# Patient Record
Sex: Male | Born: 1980 | Race: White | Hispanic: No | Marital: Married | Smoking: Never smoker
Health system: Southern US, Community
[De-identification: ages and names within clinical notes are randomized; demographics above are authoritative.]

## PROBLEM LIST (undated history)

## (undated) DIAGNOSIS — R7303 Prediabetes: Secondary | ICD-10-CM

## (undated) DIAGNOSIS — E559 Vitamin D deficiency, unspecified: Secondary | ICD-10-CM

## (undated) DIAGNOSIS — I10 Essential (primary) hypertension: Secondary | ICD-10-CM

## (undated) HISTORY — DX: Prediabetes: R73.03

## (undated) HISTORY — DX: Vitamin D deficiency, unspecified: E55.9

## (undated) HISTORY — DX: Essential (primary) hypertension: I10

---
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
## 1999-12-12 ENCOUNTER — Encounter (INDEPENDENT_AMBULATORY_CARE_PROVIDER_SITE_OTHER): Payer: Self-pay | Admitting: *Deleted

## 1999-12-12 ENCOUNTER — Ambulatory Visit (HOSPITAL_COMMUNITY): Admission: RE | Admit: 1999-12-12 | Discharge: 1999-12-12 | Payer: Self-pay | Admitting: Gastroenterology

## 2002-04-12 ENCOUNTER — Inpatient Hospital Stay (HOSPITAL_COMMUNITY): Admission: EM | Admit: 2002-04-12 | Discharge: 2002-04-13 | Payer: Self-pay | Admitting: Emergency Medicine

## 2004-03-20 ENCOUNTER — Ambulatory Visit: Payer: Self-pay | Admitting: Internal Medicine

## 2004-03-23 ENCOUNTER — Ambulatory Visit (HOSPITAL_COMMUNITY): Admission: RE | Admit: 2004-03-23 | Discharge: 2004-03-23 | Payer: Self-pay | Admitting: Internal Medicine

## 2004-04-06 ENCOUNTER — Ambulatory Visit: Payer: Self-pay | Admitting: Internal Medicine

## 2004-07-05 ENCOUNTER — Ambulatory Visit (HOSPITAL_COMMUNITY): Admission: RE | Admit: 2004-07-05 | Discharge: 2004-07-05 | Payer: Self-pay | Admitting: Internal Medicine

## 2004-07-05 ENCOUNTER — Ambulatory Visit: Payer: Self-pay | Admitting: Internal Medicine

## 2008-05-13 ENCOUNTER — Ambulatory Visit: Payer: Self-pay | Admitting: *Deleted

## 2008-05-13 DIAGNOSIS — I10 Essential (primary) hypertension: Secondary | ICD-10-CM | POA: Insufficient documentation

## 2008-05-16 LAB — CONVERTED CEMR LAB
ALT: 20 units/L (ref 0–53)
AST: 20 units/L (ref 0–37)
Albumin: 4.8 g/dL (ref 3.5–5.2)
Alkaline Phosphatase: 45 units/L (ref 39–117)
BUN: 13 mg/dL (ref 6–23)
Basophils Absolute: 0 10*3/uL (ref 0.0–0.1)
CO2: 31 meq/L (ref 19–32)
Calcium: 10 mg/dL (ref 8.4–10.5)
Creatinine, Ser: 1 mg/dL (ref 0.4–1.5)
Eosinophils Absolute: 0.2 10*3/uL (ref 0.0–0.7)
Eosinophils Relative: 3.3 % (ref 0.0–5.0)
GFR calc non Af Amer: 95 mL/min
Glucose, Bld: 117 mg/dL — ABNORMAL HIGH (ref 70–99)
HCT: 49.6 % (ref 39.0–52.0)
Hemoglobin: 17 g/dL (ref 13.0–17.0)
Lymphocytes Relative: 22 % (ref 12.0–46.0)
MCHC: 34.3 g/dL (ref 30.0–36.0)
MCV: 85.9 fL (ref 78.0–100.0)
Monocytes Absolute: 0.5 10*3/uL (ref 0.1–1.0)
Monocytes Relative: 7.3 % (ref 3.0–12.0)
Neutro Abs: 4.8 10*3/uL (ref 1.4–7.7)
Platelets: 198 10*3/uL (ref 150–400)
Potassium: 4 meq/L (ref 3.5–5.1)
Sodium: 140 meq/L (ref 135–145)
TSH: 0.88 microintl units/mL (ref 0.35–5.50)
Total Bilirubin: 1 mg/dL (ref 0.3–1.2)
Total Protein: 7.3 g/dL (ref 6.0–8.3)
WBC: 7 10*3/uL (ref 4.5–10.5)

## 2008-05-27 ENCOUNTER — Ambulatory Visit: Payer: Self-pay | Admitting: *Deleted

## 2008-05-27 DIAGNOSIS — J309 Allergic rhinitis, unspecified: Secondary | ICD-10-CM | POA: Insufficient documentation

## 2008-08-16 ENCOUNTER — Telehealth: Payer: Self-pay | Admitting: Internal Medicine

## 2008-10-24 ENCOUNTER — Telehealth: Payer: Self-pay | Admitting: Internal Medicine

## 2008-11-04 ENCOUNTER — Ambulatory Visit: Payer: Self-pay | Admitting: Internal Medicine

## 2008-11-04 LAB — CONVERTED CEMR LAB
BUN: 13 mg/dL (ref 6–23)
CO2: 27 meq/L (ref 19–32)
Calcium: 10 mg/dL (ref 8.4–10.5)
Chloride: 100 meq/L (ref 96–112)
Creatinine, Ser: 1.03 mg/dL (ref 0.40–1.50)
Glucose, Bld: 131 mg/dL — ABNORMAL HIGH (ref 70–99)
Hgb A1c MFr Bld: 5.6 % (ref 4.6–6.1)
Potassium: 3.9 meq/L (ref 3.5–5.3)
Sodium: 141 meq/L (ref 135–145)

## 2008-11-07 ENCOUNTER — Encounter: Payer: Self-pay | Admitting: Internal Medicine

## 2008-11-24 LAB — CONVERTED CEMR LAB
Dopamine 24 Hr Urine: 192 mcg/24hr (ref <500)
Metaneph Total, Ur: 489 ug/24hr (ref 94–604)
Metanephrines, Ur: 193 (ref 25–222)
Norepinephrine 24 Hr Urine: 35 mcg/24hr (ref <80)
Normetanephrine, 24H Ur: 296 (ref 40–412)

## 2010-04-01 DIAGNOSIS — E559 Vitamin D deficiency, unspecified: Secondary | ICD-10-CM

## 2010-04-01 HISTORY — DX: Vitamin D deficiency, unspecified: E55.9

## 2010-05-03 ENCOUNTER — Ambulatory Visit (HOSPITAL_BASED_OUTPATIENT_CLINIC_OR_DEPARTMENT_OTHER)
Admission: RE | Admit: 2010-05-03 | Discharge: 2010-05-03 | Disposition: A | Discharge status: Home / Self Care | Payer: BC Managed Care – PPO | Attending: Urology | Admitting: Urology

## 2010-05-03 DIAGNOSIS — Z302 Encounter for sterilization: Secondary | ICD-10-CM | POA: Insufficient documentation

## 2010-05-03 LAB — POCT I-STAT, CHEM 8
BUN: 16 mg/dL (ref 6–23)
Calcium, Ion: 1.29 mmol/L (ref 1.12–1.32)
Chloride: 104 mEq/L (ref 96–112)
Creatinine, Ser: 1 mg/dL (ref 0.4–1.5)
Glucose, Bld: 115 mg/dL — ABNORMAL HIGH (ref 70–99)
HCT: 46 % (ref 39.0–52.0)
Hemoglobin: 15.6 g/dL (ref 13.0–17.0)
Potassium: 4.2 mEq/L (ref 3.5–5.1)
Sodium: 140 mEq/L (ref 135–145)
TCO2: 28 mmol/L (ref 0–100)

## 2010-05-16 NOTE — Op Note (Signed)
  [name], Calvin Bradley                   ACCOUNT NO.:  [id number]  MEDICAL RECORD NO.:  [id number]          PATIENT TYPE:  AMB  LOCATION:  [facility]                         FACILITY:  [facility]  PHYSICIAN:  Excell Seltzer. Annabell Howells, M.D.    DATE OF BIRTH:  [birth year]  DATE OF PROCEDURE:  05/03/2010 DATE OF DISCHARGE:                              OPERATIVE REPORT   PROCEDURE:  Bilateral vasectomy.  PREOPERATIVE DIAGNOSIS:  Elective sterilization.  POSTOPERATIVE DIAGNOSIS:  Elective sterilization.  SURGEON:  Excell Seltzer. Annabell Howells, M.D.  ANESTHESIA:  General.  SPECIMENS:  Right and left vas segments which were discarded.  BLOOD LOSS:  Minimal.  COMPLICATIONS:  None.  INDICATIONS:  [name] is a 30 year old white male who desires vasectomy. He has had a prior right orchiopexy and exam in the office demonstrated some scarring of the right cord.  It was felt that general anesthetic would aid the vasectomy.  FINDINGS AND PROCEDURE:  He was taken to the operating room.  A general anesthetic was induced.  His scrotum was clipped.  He was prepped with Betadine solution and draped in usual sterile fashion.  Time-out was performed.  The anterior scrotum was infiltrated with 1 cc of lidocaine and a sharpened hemostat was used to make a puncture just to the left of midline.  The right vas was then captured with the ring clamp.  It took several attempts due to the perivasal scarring.  However, once the vas was captured, it was dissected free of the surrounding tissue using sharpened hemostat and 2 towel clips.  Once a sufficient length had been exposed, a section approximately 1 cm in length was removed.  The distal lumen was fulgurated with the Bovie.  A fascial interposition stitch was placed with 3-0 plain gut and the proximal end was doubly ligated with the gut.  Once the right vas had been managed, it was returned to the right hemiscrotum and the left vas was then delivered out more easily, dissected out  scarring.  A section was removed.  The distal lumen was cauterized.  The fascial interposition stitch was placed and the proximal end was doubly ligated with 3-0 gut.  The ends of the vas were returned to the right hemiscrotum.  Some minor bleeding was noted from the incision.  This was found to be from the dartos and was controlled with cautery and a single 3-0 chromic stitch.  Once hemostasis was achieved, the wound was closed with Dermabond.  A dressing of 4x4s, fluffs, Kerlix and scrotal support was applied.  His anesthetic was reversed.  He was moved to recovery room in stable condition.  There were no complications.     Excell Seltzer. Annabell Howells, M.D.     JJW/MEDQ  D:  05/03/2010  T:  05/03/2010  Job:  [id number]  Electronically Signed by Bjorn Pippin M.D. on 05/16/2010 09:25:09 AM

## 2010-08-17 NOTE — Procedures (Signed)
[place]. [facility]  Patient:    Calvin Bradley, Calvin Bradley                          MRN: [id number] Proc. Date: 12/12/99 Adm. Date:  [id number] Disc. Date: [id number] Attending:  Charna Elizabeth                           Procedure Report  DATE OF BIRTH:  [birth year]  PROCEDURE PERFORMED:  Colonoscopy with biopsies.  ENDOSCOPIST:  Anselmo Rod, M.D.  INSTRUMENT USED:  Olympus video colonoscope.  INDICATIONS:  Rectal bleeding in a 30 year old white male with previous history of juvenile polyps.  Rule out recurring polyps.  PREPROCEDURE PREPARATION:  Informed consent was procured from the patient. The patient was fasted for 8 hours prior to the procedure and prepped with a bottle of magnesium citrate and a gallon of NuLytely the night prior to the procedure.  PREPROCEDURE PHYSICAL:  Patient has stable vital signs.  NECK: Supple.  CHEST:  Clear to auscultation. S1, S2 regular.  ABDOMEN:  Soft with normal abdominal bowel sounds.  DESCRIPTION OF PROCEDURE:  The patient was placed in left lateral decubitus position and sedated with 100 mg of Demerol and 10 mg of Versed intravenously. Once the patient was adequately sedated and maintained on low-flow oxygen and continuous cardiac monitoring, the Olympus video colonoscope was advanced from the rectum to the cecum with slight difficulty secondary to some residual stool in the colon, especially on the right side.  A patch of erythema was seen  in the distal right colon near the hepatic flexure.  This was biopsied for pathology.  The nature of this seemed unclear.  No other abnormalities, ______, no masses or polyps were present.  The terminal ileum appeared normal except for some hyperplastic lymphoid follicles.  IMPRESSION:  Patch of erythema in the distal right colon, biopsied for pathology.  Otherwise normal-appearing colon and terminal ileum.  RECOMMENDATIONS: 1. Await pathology results. 2. Avoid  nonsteroidals. 3. Outpatient follow-up in the next two weeks. DD:  12/12/99 TD:  12/14/99 Job: [id number]

## 2010-08-17 NOTE — Op Note (Signed)
   Calvin Bradley, HONEA                             ACCOUNT NO.:  [id number]   MEDICAL RECORD NO.:  [id number]                   PATIENT TYPE:  INP   LOCATION:  5703                                 FACILITY:  [facility]   PHYSICIAN:  Alfonse Flavors, M.D.                 DATE OF BIRTH:  08-03-1980   DATE OF PROCEDURE:  04/13/2002  DATE OF DISCHARGE:                                 OPERATIVE REPORT   PREOPERATIVE DIAGNOSIS:  Tonsillar bleeding.   POSTOPERATIVE DIAGNOSIS:  Tonsillar bleeding.   OPERATION PERFORMED:  Control of tonsillar bleeding.   SURGEON:  Alfonse Flavors, M.D.   ANESTHESIA:  General endotracheal.   INDICATIONS FOR PROCEDURE:  The patient is a 30 year old patient of Dr.  Ezzard Standing, who had a tonsillectomy on January 8.  He was seen last night in the  emergency room for tonsillar bleeding and brought to the operating room.  Two bleeding areas in the right tonsillar fossa were controlled with  electrocautery. The patient did well through most of this night.  This  morning about 6 a.m. he began to have bleeding again.  Examination showed a  clot in the right tonsillar fossa.  He is returned to the operating room for  further control.   DESCRIPTION OF PROCEDURE:  The patient was brought to the operating room and  placed supine on the operating table.  He was induced with general  anesthesia, intubated with an orotracheal tube.  His face was draped in  sterile fashion.  The mouth was opened with a Crowe-Davis mouth gag.  Examination of the oropharynx showed clotted blood in the inferior right  tonsillar fossa.  This was removed with suction.  The patient was found to  have a small arterial source of bleeding several millimeters above the  bleeding site of last night.  The bleeding was controlled with suction  cautery.  All other hemorrhagic appearing areas were cauterized.  There was  a small dark area of clotted blood in the inferior left tonsillar fossa.  This was cauterized  also.  The patient tolerated the procedure well and was  taken to the recovery area in satisfactory condition.   FOLLOW UP:  Dr. Ezzard Standing will assume care of the patient. He will be observed  in the hospital through today, or possibly longer.                                                Alfonse Flavors, M.D.    JCM/MEDQ  D:  04/13/2002  T:  04/13/2002  Job:  [id number]

## 2010-08-17 NOTE — Op Note (Signed)
Calvin Bradley, Calvin Bradley                             ACCOUNT NO.:  [id number]   MEDICAL RECORD NO.:  [id number]                   PATIENT TYPE:  INP   LOCATION:  2853                                 FACILITY:  [facility]   PHYSICIAN:  Alfonse Flavors, M.D.                 DATE OF BIRTH:  Jan 29, 1981   DATE OF PROCEDURE:  04/12/2002  DATE OF DISCHARGE:                                 OPERATIVE REPORT   INDICATIONS/JUSTIFICATION FOR PROCEDURE:  The patient is a 30 year old  patient who had a tonsillectomy by Dr. Ezzard Standing on April 08, 2002. The  patient had a small amount of bleeding last night, and in the morning. He  saw Dr. Ezzard Standing in the office and was treated with silver nitrate. He began  to have bleeding again this evening. He had continued to bleed and contacted  me by telephone. He was examined in the emergency room and had active  bleeding from the right tonsillar fossa. He was a candidate for control of  his bleeding under general anesthesia in the operating room. The indications  and complications including persistent bleeding were discussed with the  patient and also his parents.   PREOPERATIVE DIAGNOSIS:  Tonsil bleeding.   POSTOPERATIVE DIAGNOSIS:  Tonsil bleeding.   PROCEDURE:  Control of tonsillar bleeding.   ANESTHESIA:  General endotracheal anesthesia.   SURGEON:  Alfonse Flavors, M.D.   DESCRIPTION OF PROCEDURE:  The patient was brought to the operating room and  placed on the operating table in the supine position. He was induced with  general anesthesia and intubated with an orotracheal tube. There was no  signs of any aspiration. The face was draped in a sterile fashion. His mouth  was opened with a Crowe-Davis mouth gag.   Examination of the oropharynx showed bright red blood in the right tonsillar  fossa. The tonsillar fossa was cleaned with suction cautery. Active bleeding  areas were found at the inferior aspect of the tonsillar fossa. There was  also a small active  bleeding area in the mid tonsillar fossa. Both of these  areas were cauterized with electrocautery, successfully controlling  bleeding. There was no active bleeding from the left tonsillar fossa.   A nasogastric tube was passed into the esophagus but would not pass the  gastroesophageal junction. The effort was discontinued in attempting to pass  the tube without undue pressure on the esophagus.   The patient was taken to the recovery room in satisfactory condition. The  patient will be admitted for overnight observation and intravenous hydration  and analgesia. His discharge is anticipated in the morning. Discharge  medications will be those provided by Dr. Ezzard Standing.  Alfonse Flavors, M.D.     JCM/MEDQ  D:  04/12/2002  T:  04/12/2002  Job:  [id number]

## 2010-08-31 DIAGNOSIS — R7303 Prediabetes: Secondary | ICD-10-CM

## 2010-08-31 HISTORY — DX: Prediabetes: R73.03

## 2011-10-21 ENCOUNTER — Ambulatory Visit (HOSPITAL_COMMUNITY)
Admission: RE | Admit: 2011-10-21 | Discharge: 2011-10-21 | Disposition: A | Discharge status: Home / Self Care | Payer: BC Managed Care – PPO | Source: Ambulatory Visit | Attending: Internal Medicine | Admitting: Internal Medicine

## 2011-10-21 ENCOUNTER — Other Ambulatory Visit: Payer: Self-pay | Admitting: Internal Medicine

## 2011-10-21 ENCOUNTER — Other Ambulatory Visit (HOSPITAL_COMMUNITY): Payer: Self-pay | Admitting: Internal Medicine

## 2011-10-21 ENCOUNTER — Ambulatory Visit
Admission: RE | Admit: 2011-10-21 | Discharge: 2011-10-21 | Disposition: A | Discharge status: Home / Self Care | Payer: BC Managed Care – PPO | Source: Ambulatory Visit | Attending: Internal Medicine | Admitting: Internal Medicine

## 2011-10-21 DIAGNOSIS — R079 Chest pain, unspecified: Secondary | ICD-10-CM | POA: Insufficient documentation

## 2011-10-21 DIAGNOSIS — R071 Chest pain on breathing: Secondary | ICD-10-CM | POA: Insufficient documentation

## 2011-10-21 DIAGNOSIS — R109 Unspecified abdominal pain: Secondary | ICD-10-CM

## 2013-01-04 IMAGING — US US ABDOMEN COMPLETE
1 series · 14 of 25 positions shown · non-contrast
Comparison: None.

CLINICAL DATA: Fall, right abdominal pain

COMPLETE ABDOMINAL ULTRASOUND

[Series 1: us abdomen complete · 0.18mm/px · 14 of 73 slices shown]
[im 1/73]
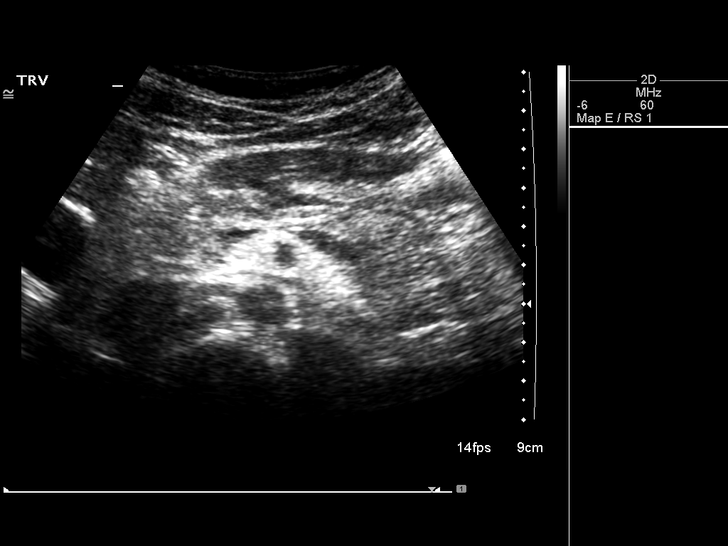
[im 7/73]
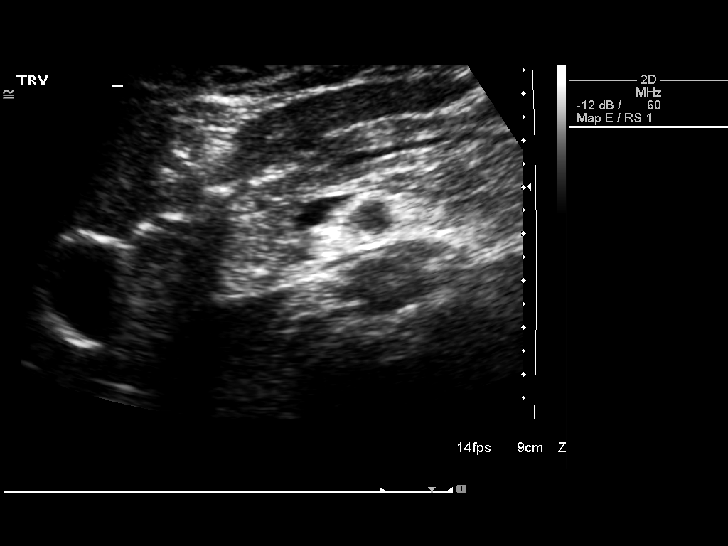
[im 13/73]
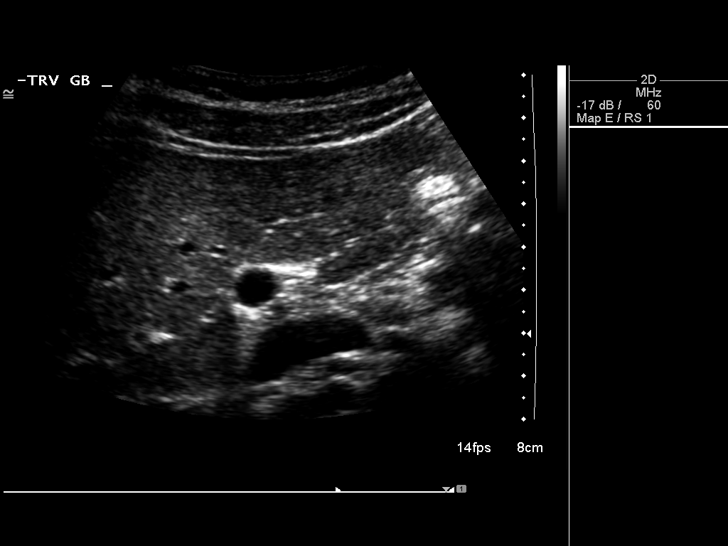
[im 19/73]
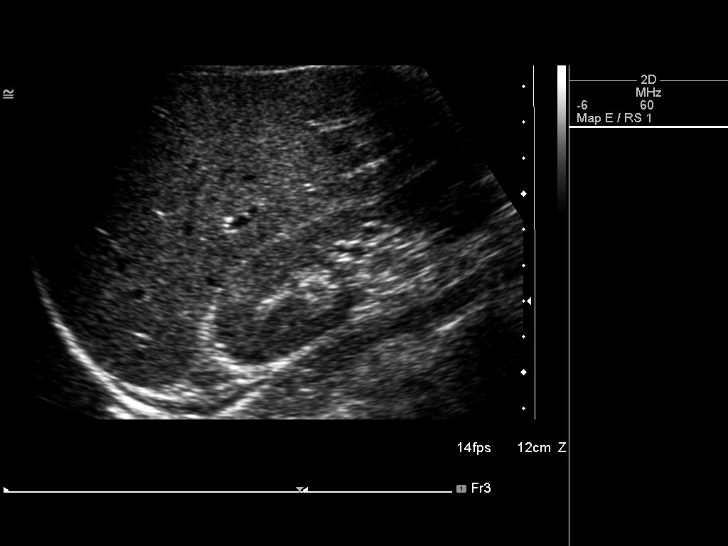
[im 25/73]
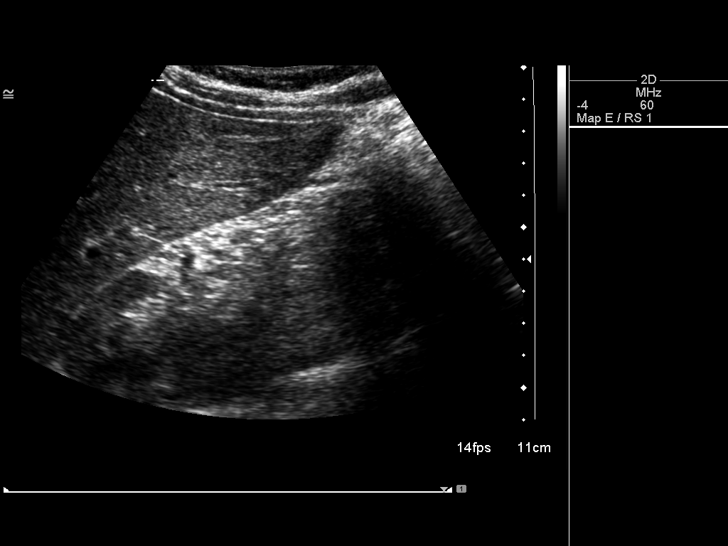
[im 28/73]
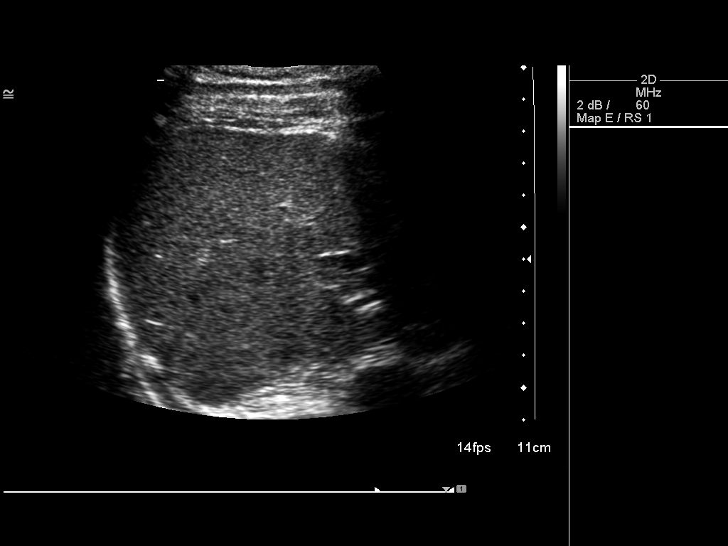
[im 34/73]
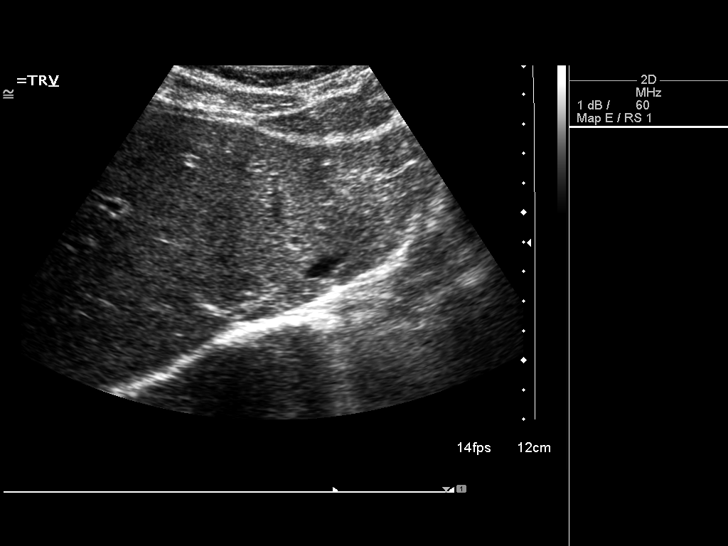
[im 40/73]
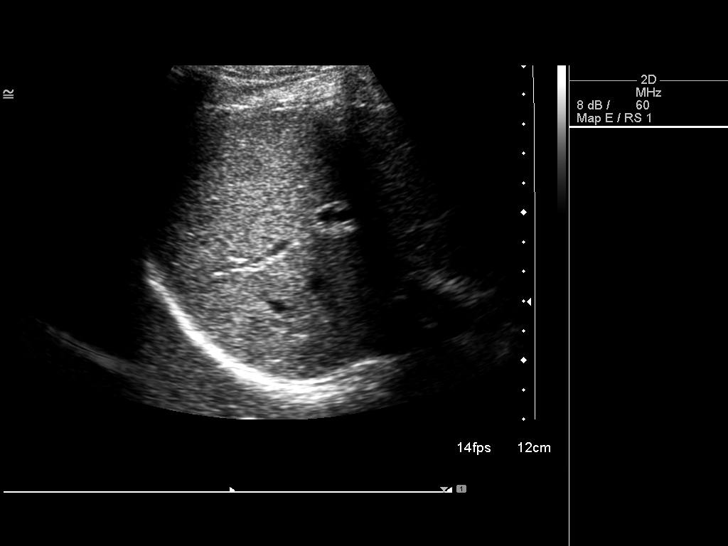
[im 46/73]
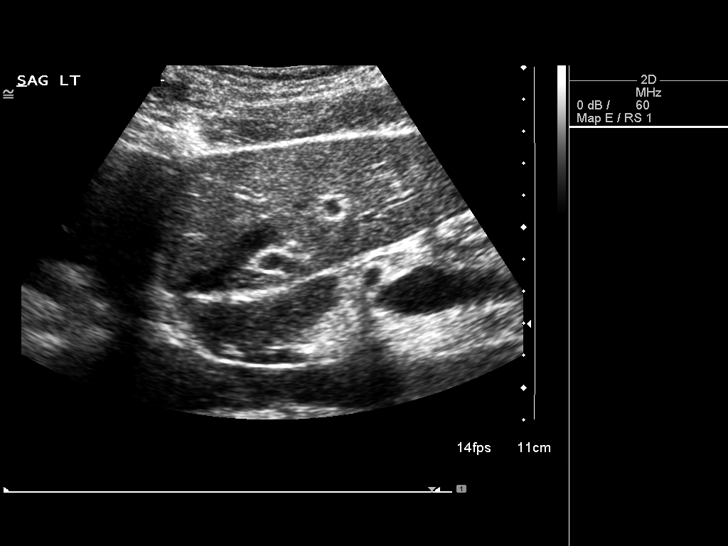
[im 49/73]
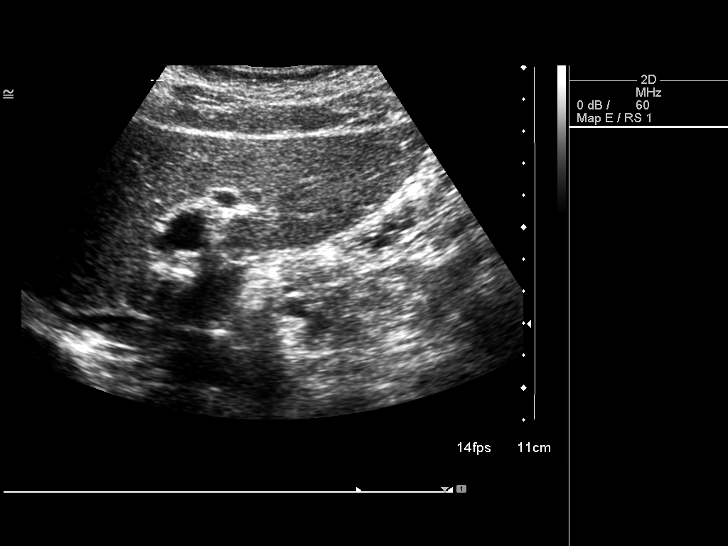
[im 55/73]
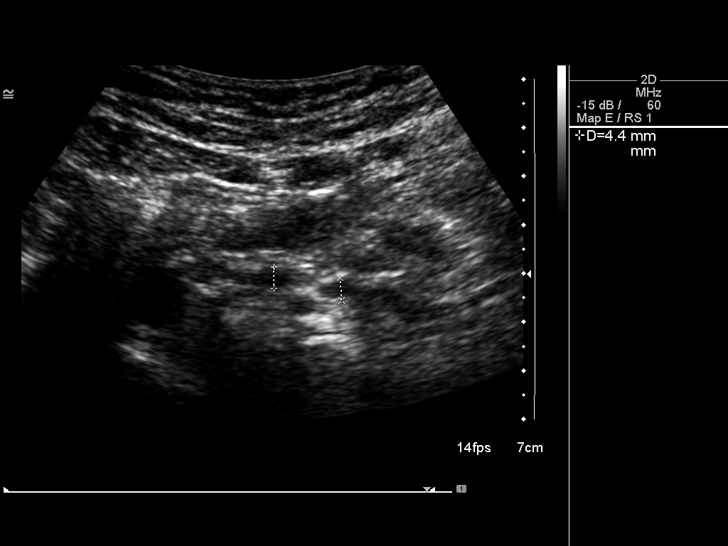
[im 61/73]
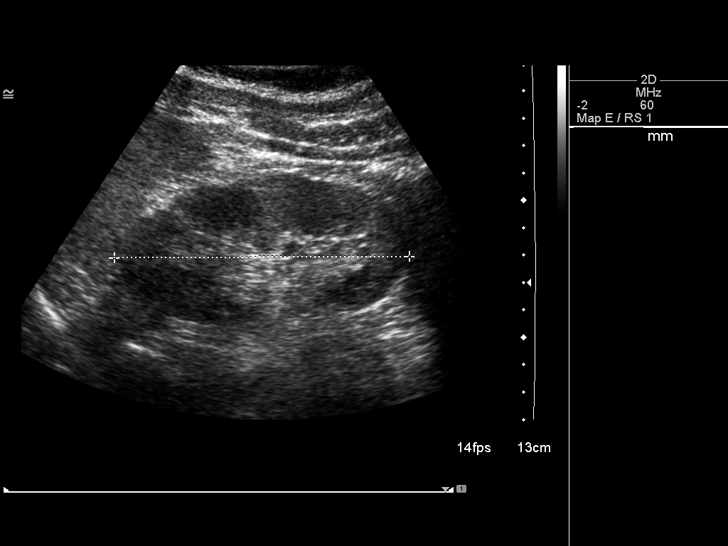
[im 67/73]
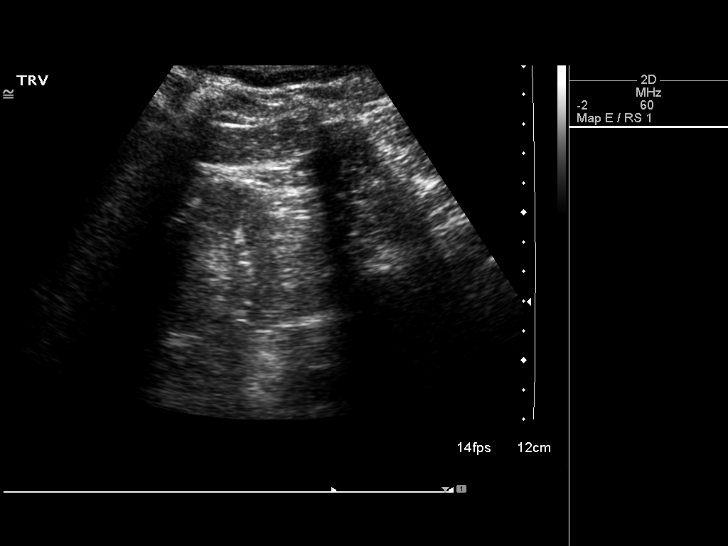
[im 73/73]
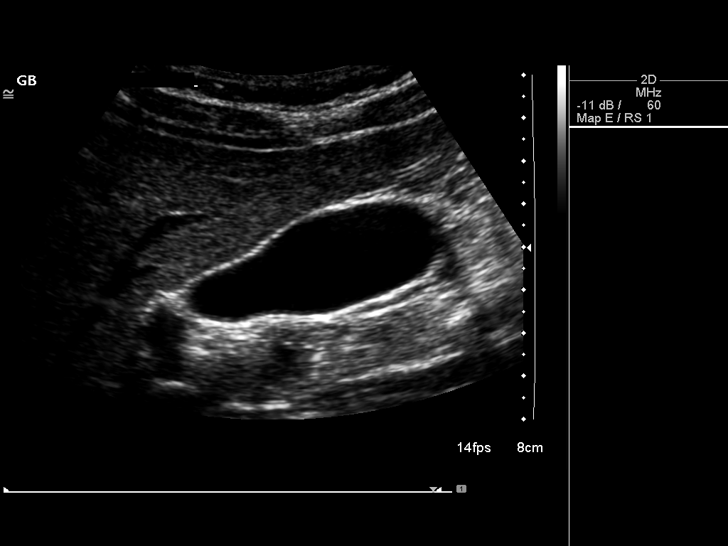

[14 of 25 positions shown; findings below may reference images not displayed]

FINDINGS: Gallbladder:  No gallstones, gallbladder wall thickening, or
pericholecystic fluid.  Negative sonographic Murphy's sign.

Common bile duct:  Measures 2 mm.

Liver:  No focal lesion identified.  Within normal limits in
parenchymal echogenicity.

IVC:  Appears normal.

Pancreas:  No focal abnormality seen.

Spleen:  Measures 6.7 cm.

Right Kidney:  Measures 10.8 cm.  No mass or hydronephrosis.

Left Kidney:  Measures 10.8 cm.  No mass or hydronephrosis.

Abdominal aorta:  No aneurysm identified.
IMPRESSION: Negative abdominal ultrasound.

## 2013-02-09 ENCOUNTER — Other Ambulatory Visit: Payer: Self-pay | Admitting: Emergency Medicine

## 2013-03-23 ENCOUNTER — Encounter: Payer: Self-pay | Admitting: Internal Medicine

## 2013-03-23 ENCOUNTER — Ambulatory Visit (INDEPENDENT_AMBULATORY_CARE_PROVIDER_SITE_OTHER): Payer: BC Managed Care – PPO | Admitting: Internal Medicine

## 2013-03-23 VITALS — BP 146/96 | HR 80 | Temp 99.3°F | Resp 16 | Wt 143.2 lb

## 2013-03-23 DIAGNOSIS — B349 Viral infection, unspecified: Secondary | ICD-10-CM

## 2013-03-23 DIAGNOSIS — J111 Influenza due to unidentified influenza virus with other respiratory manifestations: Secondary | ICD-10-CM

## 2013-03-23 DIAGNOSIS — B9789 Other viral agents as the cause of diseases classified elsewhere: Secondary | ICD-10-CM

## 2013-03-23 MED ORDER — AZITHROMYCIN 250 MG PO TABS: ORAL_TABLET | ORAL | Status: AC

## 2013-03-23 MED ORDER — PREDNISONE 20 MG PO TABS: ORAL_TABLET | ORAL | Status: DC

## 2013-03-23 MED ORDER — HYDROCODONE-ACETAMINOPHEN 5-325 MG PO TABS: 1.0000 | ORAL_TABLET | Freq: Four times a day (QID) | ORAL | Status: DC | PRN

## 2013-03-23 NOTE — Progress Notes (Signed)
   Subjective:    Patient ID: Calvin Bradley, male    DOB: 06-14-1980, 32 y.o.   MRN: [id number]  Cough This is a new problem. The current episode started in the past 7 days. The problem has been unchanged. The problem occurs constantly. The cough is non-productive. Associated symptoms include chest pain, chills, a fever, headaches, myalgias, nasal congestion, postnasal drip, rhinorrhea, a sore throat and sweats. Pertinent negatives include no ear congestion, ear pain, heartburn, hemoptysis, rash or shortness of breath.  Sore Throat  Associated symptoms include congestion, coughing and headaches. Pertinent negatives include no ear pain or shortness of breath.      Review of Systems  Constitutional: Positive for fever, chills, diaphoresis and fatigue.  HENT: Positive for congestion, postnasal drip, rhinorrhea and sore throat. Negative for ear pain.   Eyes: Negative.   Respiratory: Positive for cough. Negative for hemoptysis and shortness of breath.   Cardiovascular: Positive for chest pain.  Gastrointestinal: Negative for heartburn.  Genitourinary: Negative.   Musculoskeletal: Positive for myalgias.  Skin: Negative for rash.  Neurological: Positive for headaches.       Objective:   Physical Exam  Constitutional: He is oriented to person, place, and time. He appears well-developed and well-nourished.  HENT:  Head: Atraumatic.  Nose: Nose normal.  Mouth/Throat: Oropharynx is clear and moist.  Eyes: EOM are normal. Pupils are equal, round, and reactive to light.  Neck: Normal range of motion. Neck supple. No JVD present. No thyromegaly present.  Cardiovascular: Normal rate, regular rhythm and normal heart sounds.   No murmur heard. Pulmonary/Chest: Breath sounds normal. No respiratory distress. He has no wheezes. He has no rales. He exhibits no tenderness.  Abdominal: Soft.  Lymphadenopathy:    He has no cervical adenopathy.  Neurological: He is alert and oriented to person, place,  and time. No cranial nerve deficit. Coordination normal.  Skin: Skin is warm and dry. No rash noted. No erythema.  Psychiatric: He has a normal mood and affect.          Assessment & Plan:   1. Influenza with respiratory manifestations  Rx Zpak, Steroid pulse taper to prevent secondary bacterial infection, norco for aches/pains  2. Viral illness  - POCT Rapid Influenza A&B +positive for influenza A

## 2013-03-23 NOTE — Patient Instructions (Signed)

## 2013-07-09 ENCOUNTER — Encounter: Payer: Self-pay | Admitting: Internal Medicine

## 2013-07-09 NOTE — Progress Notes (Signed)
Patient ID: Calvin CantorKevin R Bradley, male   DOB: Sep 27, 1980, 33 y.o.   MRN: [id number]        E R R O R

## 2013-12-27 ENCOUNTER — Encounter: Payer: Self-pay | Admitting: Internal Medicine

## 2013-12-27 ENCOUNTER — Ambulatory Visit (INDEPENDENT_AMBULATORY_CARE_PROVIDER_SITE_OTHER): Payer: BC Managed Care – PPO | Admitting: Internal Medicine

## 2013-12-27 VITALS — BP 132/84 | HR 88 | Temp 97.0°F | Resp 16 | Ht 69.0 in | Wt 149.6 lb

## 2013-12-27 DIAGNOSIS — M542 Cervicalgia: Secondary | ICD-10-CM

## 2013-12-27 DIAGNOSIS — E559 Vitamin D deficiency, unspecified: Secondary | ICD-10-CM | POA: Insufficient documentation

## 2013-12-27 MED ORDER — PREDNISONE 20 MG PO TABS: ORAL_TABLET | ORAL | Status: DC

## 2013-12-27 MED ORDER — HYDROCODONE-ACETAMINOPHEN 5-325 MG PO TABS: ORAL_TABLET | ORAL | Status: DC

## 2013-12-27 NOTE — Patient Instructions (Signed)
Radicular Pain  Radicular pain in either the arm or leg is usually from a bulging or herniated disk in the spine. A piece of the herniated disk may press against the nerves as the nerves exit the spine. This causes pain which is felt at the tips of the nerves down the arm or leg. Other causes of radicular pain may include:   Fractures.   Heart disease.   Cancer.   An abnormal and usually degenerative state of the nervous system or nerves (neuropathy).  Diagnosis may require CT or MRI scanning to determine the primary cause.   Nerves that start at the neck (nerve roots) may cause radicular pain in the outer shoulder and arm. It can spread down to the thumb and fingers. The symptoms vary depending on which nerve root has been affected. In most cases radicular pain improves with conservative treatment. Neck problems may require physical therapy, a neck collar, or cervical traction. Treatment may take many weeks, and surgery may be considered if the symptoms do not improve.   Conservative treatment is also recommended for sciatica. Sciatica causes pain to radiate from the lower back or buttock area down the leg into the foot. Often there is a history of back problems. Most patients with sciatica are better after 2 to 4 weeks of rest and other supportive care. Short term bed rest can reduce the disk pressure considerably. Sitting, however, is not a good position since this increases the pressure on the disk. You should avoid bending, lifting, and all other activities which make the problem worse. Traction can be used in severe cases. Surgery is usually reserved for patients who do not improve within the first months of treatment.  Only take over-the-counter or prescription medicines for pain, discomfort, or fever as directed by your caregiver. Narcotics and muscle relaxants may help by relieving more severe pain and spasm and by providing mild sedation. Cold or massage can give significant relief. Spinal manipulation  is not recommended. It can increase the degree of disc protrusion. Epidural steroid injections are often effective treatment for radicular pain. These injections deliver medicine to the spinal nerve in the space between the protective covering of the spinal cord and back bones (vertebrae). Your caregiver can give you more information about steroid injections. These injections are most effective when given within two weeks of the onset of pain.   You should see your caregiver for follow up care as recommended. A program for neck and back injury rehabilitation with stretching and strengthening exercises is an important part of management.   SEEK IMMEDIATE MEDICAL CARE IF:   You develop increased pain, weakness, or numbness in your arm or leg.   You develop difficulty with bladder or bowel control.   You develop abdominal pain.  Document Released: 04/25/2004 Document Revised: 06/10/2011 Document Reviewed: 07/11/2008  ExitCare Patient Information 2015 [employer]. This information is not intended to replace advice given to you by your health care provider. Make sure you discuss any questions you have with your health care provider.    Sciatica  Sciatica is pain, weakness, numbness, or tingling along the path of the sciatic nerve. The nerve starts in the lower back and runs down the back of each leg. The nerve controls the muscles in the lower leg and in the back of the knee, while also providing sensation to the back of the thigh, lower leg, and the sole of your foot. Sciatica is a symptom of another medical condition. For instance, nerve   damage or certain conditions, such as a herniated disk or bone spur on the spine, pinch or put pressure on the sciatic nerve. This causes the pain, weakness, or other sensations normally associated with sciatica. Generally, sciatica only affects one side of the body.  CAUSES    Herniated or slipped disc.   Degenerative disk disease.   A pain disorder involving the narrow  muscle in the buttocks (piriformis syndrome).   Pelvic injury or fracture.   Pregnancy.   Tumor (rare).  SYMPTOMS   Symptoms can vary from mild to very severe. The symptoms usually travel from the low back to the buttocks and down the back of the leg. Symptoms can include:   Mild tingling or dull aches in the lower back, leg, or hip.   Numbness in the back of the calf or sole of the foot.   Burning sensations in the lower back, leg, or hip.   Sharp pains in the lower back, leg, or hip.   Leg weakness.   Severe back pain inhibiting movement.  These symptoms may get worse with coughing, sneezing, laughing, or prolonged sitting or standing. Also, being overweight may worsen symptoms.  DIAGNOSIS   Your caregiver will perform a physical exam to look for common symptoms of sciatica. He or she may ask you to do certain movements or activities that would trigger sciatic nerve pain. Other tests may be performed to find the cause of the sciatica. These may include:   Blood tests.   X-rays.   Imaging tests, such as an MRI or CT scan.  TREATMENT   Treatment is directed at the cause of the sciatic pain. Sometimes, treatment is not necessary and the pain and discomfort goes away on its own. If treatment is needed, your caregiver may suggest:   Over-the-counter medicines to relieve pain.   Prescription medicines, such as anti-inflammatory medicine, muscle relaxants, or narcotics.   Applying heat or ice to the painful area.   Steroid injections to lessen pain, irritation, and inflammation around the nerve.   Reducing activity during periods of pain.   Exercising and stretching to strengthen your abdomen and improve flexibility of your spine. Your caregiver may suggest losing weight if the extra weight makes the back pain worse.   Physical therapy.   Surgery to eliminate what is pressing or pinching the nerve, such as a bone spur or part of a herniated disk.  HOME CARE INSTRUCTIONS    Only take over-the-counter  or prescription medicines for pain or discomfort as directed by your caregiver.   Apply ice to the affected area for 20 minutes, 3-4 times a day for the first 48-72 hours. Then try heat in the same way.   Exercise, stretch, or perform your usual activities if these do not aggravate your pain.   Attend physical therapy sessions as directed by your caregiver.   Keep all follow-up appointments as directed by your caregiver.   Do not wear high heels or shoes that do not provide proper support.   Check your mattress to see if it is too soft. A firm mattress may lessen your pain and discomfort.  SEEK IMMEDIATE MEDICAL CARE IF:    You lose control of your bowel or bladder (incontinence).   You have increasing weakness in the lower back, pelvis, buttocks, or legs.   You have redness or swelling of your back.   You have a burning sensation when you urinate.   You have pain that gets worse when you   lie down or awakens you at night.   Your pain is worse than you have experienced in the past.   Your pain is lasting longer than 4 weeks.   You are suddenly losing weight without reason.  MAKE SURE YOU:   Understand these instructions.   Will watch your condition.   Will get help right away if you are not doing well or get worse.  Document Released: 03/12/2001 Document Revised: 09/17/2011 Document Reviewed: 07/28/2011  Exit[employer] Patient Information 2015 [employer]. This information is not intended to replace advice given to you by your health care provider. Make sure you discuss any questions you have with your health care provider.

## 2013-12-27 NOTE — Progress Notes (Signed)
   Subjective:    Patient ID: Calvin Bradley, male    DOB: [birth year], 33 y.o.   MRN: [id number]  HPI  Nice 33 yo MWM presenting with 3 week hx/o awaking with a "crick" in his rt neck subsequently radiating to the rt scapula and occas global numbness of the rt arm w/o any motor deficit noted. Pains varies in severity and occasionally awakens him and prevents sleep.  Also patient reports he has been off & out of his BP meds x 2 weeks and BP is noted normal today.   Medication List   bisoprolol 10 MG tablet  ---out x 2 weeks  Commonly known as:  ZEBETA  Take 10 mg by mouth daily.     VIAGRA 100 MG tablet  Generic drug:  sildenafil     Allergies  Allergen Reactions  . Lisinopril     Face and hand numbness    Review of Systems non contributory to above  Objective:   Physical Exam BP 132/84  Pulse 88  Temp(Src) 97 F (36.1 C) (Temporal)  Resp 16  Ht  (1.753 m)  Wt 149 lb 9.6 oz (67.858 kg)  BMI 22.08 kg/m2  HEENT - Eac's patent. TM's Nl. EOM's full. PERRLA. NasoOroPharynx clear. Neck - supple. Nl Thyroid. Carotids 2+ & No bruits, nodes, JVD Chest - Clear equal BS w/o Rales, rhonchi, wheezes. Cor - Nl HS. RRR w/o sig MGR. PP 1(+). No edema. Abd - No palpable organomegaly, masses or tenderness. BS nl. MS- FROM w/o deformities. Muscle power, tone and bulk Nl. Gait Nl. ROM RUE is nl with Nl sensory also.  Neuro - No obvious Cr N abnormalities. Sensory, motor and Cerebellar functions appear Nl w/o focal abnormalities. Psyche - Mental status normal & appropriate.  No delusions, ideations or obvious mood abnormalities.  Assessment & Plan:   1. Cervical pain (neck) - suspect nerve impingement.  - Rx Prednisone pulse/taper  -Rx Norco 5 prn with sedative precautions  Advised if not significantly improved in 7-10 days to call to schedule CX MRI.  Also advised monitor BP's & call if over 145/95.

## 2014-01-04 ENCOUNTER — Encounter: Payer: Self-pay | Admitting: Internal Medicine

## 2014-03-11 ENCOUNTER — Ambulatory Visit: Payer: 59 | Admitting: Internal Medicine

## 2014-03-13 NOTE — Progress Notes (Signed)
Patient ID: Calvin CantorKevin R Bradley, male   DOB: 04-06-1980   Calvin AcresN  O    S  H  O  W

## 2014-03-18 ENCOUNTER — Other Ambulatory Visit: Payer: Self-pay | Admitting: Internal Medicine

## 2014-03-23 ENCOUNTER — Other Ambulatory Visit: Payer: Self-pay | Admitting: Internal Medicine

## 2014-05-05 ENCOUNTER — Ambulatory Visit (INDEPENDENT_AMBULATORY_CARE_PROVIDER_SITE_OTHER): Payer: 59 | Admitting: Internal Medicine

## 2014-05-05 ENCOUNTER — Encounter: Payer: Self-pay | Admitting: Internal Medicine

## 2014-05-05 VITALS — BP 126/86 | HR 92 | Temp 98.6°F | Resp 16 | Ht 69.0 in | Wt 154.2 lb

## 2014-05-05 DIAGNOSIS — M542 Cervicalgia: Secondary | ICD-10-CM

## 2014-05-05 DIAGNOSIS — M5412 Radiculopathy, cervical region: Secondary | ICD-10-CM

## 2014-05-05 MED ORDER — TIZANIDINE HCL 4 MG PO TABS: 4.0000 mg | ORAL_TABLET | Freq: Four times a day (QID) | ORAL | Status: AC | PRN

## 2014-05-05 MED ORDER — SILDENAFIL CITRATE 100 MG PO TABS: ORAL_TABLET | ORAL | Status: AC

## 2014-05-05 MED ORDER — HYDROCODONE-ACETAMINOPHEN 5-325 MG PO TABS: ORAL_TABLET | ORAL | Status: AC

## 2014-05-05 MED ORDER — PREDNISONE 20 MG PO TABS
ORAL_TABLET | ORAL | Status: AC
Start: 2014-05-05 — End: 2014-06-03

## 2014-05-05 MED ORDER — SILDENAFIL CITRATE 100 MG PO TABS: ORAL_TABLET | ORAL | Status: DC

## 2014-05-05 NOTE — Patient Instructions (Signed)

## 2014-05-05 NOTE — Progress Notes (Signed)
   Subjective:    Patient ID: Calvin Bradley, male    DOB: 1980-05-11, 34 y.o.   MRN: [id number]  HPI Patient presents with a 2sd episode similar to one in Sept 2015 with Rt neck pain radiating to the RUE to the fingers. Current episode x 5 days and slightly improving - pain occas awakens from sleep. Has decreased strength of the RUE.   Medication Sig  . bisoprolol (ZEBETA) 10 MG tablet Take 10 mg by mouth daily.   Marland Kitchen. VIAGRA 100 MG tablet TAKE ONE-HALF TO ONE TABLET BY MOUTH ONCE DAILY AS NEEDED   Allergies  Allergen Reactions  . Lisinopril     Face and hand numbness    Past Medical History  Diagnosis Date  . Hypertension   . Prediabetes 08/2010  . Unspecified vitamin D deficiency 2012   Review of Systems Systems Review (+) as above.    Objective:   Physical Exam  BP 126/86 mmHg  Pulse 92  Temp(Src) 98.6 F (37 C)  Resp 16  Ht 5\' 9"  (1.753 m)  Wt 154 lb 3.2 oz (69.945 kg)  BMI 22.76 kg/m2  HEENT - Eac's patent. TM's Nl. EOM's full. PERRLA. NasoOroPharynx clear. Neck - supple. Nl Thyroid. Carotids 2+ & No bruits, nodes, JVD Chest - Clear equal BS w/o Rales, rhonchi, wheezes. Cor - Nl HS. RRR w/o sig MGR. PP 1(+). No edema. Abd - No palpable organomegaly, masses or tenderness. BS nl. MS- FROM w/o deformities. Muscle power, tone and bulk Nl. Gait Nl. Tender low right para cx muscle spasm.  Neuro - No obvious Cr N abnormalities. Sensory, motor and Cerebellar functions appear Nl w/o focal abnormalities. Bilat hand grips nl/equal. Sl decrease in Right biceps flexion.  Psyche - Mental status normal & appropriate.  No delusions, ideations or obvious mood abnormalities.    Assessment & Plan:   1. Cervicalgia   2. Cervical radiculitis   - Rx Prednisone pulse/taper - Rx Tizanidine 4mg  - Norco 5   - advised patient if not significantly improved in 5 day - will anticipate scheduling MRI to r/o Cx DDD / HNP.

## 2014-10-11 ENCOUNTER — Ambulatory Visit: Payer: Self-pay | Admitting: Internal Medicine

## 2015-01-11 ENCOUNTER — Encounter: Payer: Self-pay | Admitting: Internal Medicine

## 2015-04-05 ENCOUNTER — Encounter: Payer: Self-pay | Admitting: Internal Medicine

## 2018-07-23 ENCOUNTER — Other Ambulatory Visit: Payer: Self-pay

## 2018-07-23 ENCOUNTER — Emergency Department (HOSPITAL_COMMUNITY): Payer: PRIVATE HEALTH INSURANCE

## 2018-07-23 ENCOUNTER — Encounter (HOSPITAL_COMMUNITY): Payer: Self-pay | Admitting: *Deleted

## 2018-07-23 ENCOUNTER — Emergency Department (HOSPITAL_COMMUNITY): Payer: PRIVATE HEALTH INSURANCE | Admitting: Certified Registered Nurse Anesthetist

## 2018-07-23 ENCOUNTER — Encounter (HOSPITAL_COMMUNITY)
Admission: EM | Disposition: A | Discharge status: Home / Self Care | Payer: Self-pay | Source: Home / Self Care | Attending: Emergency Medicine

## 2018-07-23 ENCOUNTER — Ambulatory Visit: Admit: 2018-07-23 | Payer: Self-pay | Admitting: General Surgery

## 2018-07-23 ENCOUNTER — Ambulatory Visit (HOSPITAL_COMMUNITY)
Admission: EM | Admit: 2018-07-23 | Discharge: 2018-07-23 | Disposition: A | Discharge status: Home / Self Care | Payer: PRIVATE HEALTH INSURANCE | Attending: Emergency Medicine | Admitting: Emergency Medicine

## 2018-07-23 DIAGNOSIS — Z888 Allergy status to other drugs, medicaments and biological substances status: Secondary | ICD-10-CM | POA: Insufficient documentation

## 2018-07-23 DIAGNOSIS — Y990 Civilian activity done for income or pay: Secondary | ICD-10-CM | POA: Insufficient documentation

## 2018-07-23 DIAGNOSIS — N529 Male erectile dysfunction, unspecified: Secondary | ICD-10-CM | POA: Diagnosis not present

## 2018-07-23 DIAGNOSIS — S68119A Complete traumatic metacarpophalangeal amputation of unspecified finger, initial encounter: Secondary | ICD-10-CM

## 2018-07-23 DIAGNOSIS — S68623A Partial traumatic transphalangeal amputation of left middle finger, initial encounter: Secondary | ICD-10-CM | POA: Insufficient documentation

## 2018-07-23 DIAGNOSIS — I10 Essential (primary) hypertension: Secondary | ICD-10-CM | POA: Insufficient documentation

## 2018-07-23 DIAGNOSIS — W3182XA Contact with other commercial machinery, initial encounter: Secondary | ICD-10-CM | POA: Diagnosis not present

## 2018-07-23 DIAGNOSIS — Z79899 Other long term (current) drug therapy: Secondary | ICD-10-CM | POA: Insufficient documentation

## 2018-07-23 HISTORY — PX: AMPUTATION FINGER: SHX6594

## 2018-07-23 LAB — CBC WITH DIFFERENTIAL/PLATELET
Abs Immature Granulocytes: 0.02 10*3/uL (ref 0.00–0.07)
Basophils Absolute: 0.1 10*3/uL (ref 0.0–0.1)
Basophils Relative: 1 %
Eosinophils Absolute: 0.1 10*3/uL (ref 0.0–0.5)
Eosinophils Relative: 2 %
HCT: 46.1 % (ref 39.0–52.0)
Hemoglobin: 16.2 g/dL (ref 13.0–17.0)
Immature Granulocytes: 0 %
Lymphocytes Relative: 25 %
Lymphs Abs: 1.6 10*3/uL (ref 0.7–4.0)
MCH: 30.6 pg (ref 26.0–34.0)
MCHC: 35.1 g/dL (ref 30.0–36.0)
MCV: 87 fL (ref 80.0–100.0)
Monocytes Absolute: 0.5 10*3/uL (ref 0.1–1.0)
Monocytes Relative: 9 %
Neutro Abs: 4 10*3/uL (ref 1.7–7.7)
Neutrophils Relative %: 63 %
Platelets: 231 10*3/uL (ref 150–400)
RBC: 5.3 MIL/uL (ref 4.22–5.81)
RDW: 12.9 % (ref 11.5–15.5)
WBC: 6.3 10*3/uL (ref 4.0–10.5)
nRBC: 0 % (ref 0.0–0.2)

## 2018-07-23 LAB — COMPREHENSIVE METABOLIC PANEL
ALT: 35 U/L (ref 0–44)
AST: 28 U/L (ref 15–41)
Albumin: 4.5 g/dL (ref 3.5–5.0)
Alkaline Phosphatase: 79 U/L (ref 38–126)
Anion gap: 12 (ref 5–15)
BUN: 12 mg/dL (ref 6–20)
CO2: 26 mmol/L (ref 22–32)
Calcium: 9.6 mg/dL (ref 8.9–10.3)
Chloride: 102 mmol/L (ref 98–111)
Creatinine, Ser: 1.11 mg/dL (ref 0.61–1.24)
GFR calc Af Amer: >60 mL/min (ref >60)
GFR calc non Af Amer: >60 mL/min (ref >60)
Glucose, Bld: 139 mg/dL — ABNORMAL HIGH (ref 70–99)
Potassium: 3.9 mmol/L (ref 3.5–5.1)
Sodium: 140 mmol/L (ref 135–145)
Total Bilirubin: 0.9 mg/dL (ref 0.3–1.2)
Total Protein: 7.4 g/dL (ref 6.5–8.1)

## 2018-07-23 LAB — SURGICAL PCR SCREEN
MRSA, PCR: NEGATIVE
Staphylococcus aureus: NEGATIVE

## 2018-07-23 SURGERY — AMPUTATION, FINGER
Anesthesia: General | Site: Hand | Laterality: Left

## 2018-07-23 MED ORDER — CHLORHEXIDINE GLUCONATE 4 % EX LIQD
60.0000 mL | Freq: Once | CUTANEOUS | Status: DC
  Filled 2018-07-23: qty 60

## 2018-07-23 MED ORDER — 0.9 % SODIUM CHLORIDE (POUR BTL) OPTIME
TOPICAL | Status: DC | PRN
  Administered 2018-07-23: 1000 mL

## 2018-07-23 MED ORDER — ONDANSETRON HCL 4 MG/2ML IJ SOLN
INTRAMUSCULAR | Status: DC | PRN
  Administered 2018-07-23: 4 mg via INTRAVENOUS

## 2018-07-23 MED ORDER — SUCCINYLCHOLINE CHLORIDE 200 MG/10ML IV SOSY
PREFILLED_SYRINGE | INTRAVENOUS | Status: DC | PRN
  Administered 2018-07-23: 140 mg via INTRAVENOUS

## 2018-07-23 MED ORDER — DEXAMETHASONE SODIUM PHOSPHATE 10 MG/ML IJ SOLN
INTRAMUSCULAR | Status: DC | PRN
  Administered 2018-07-23: 5 mg via INTRAVENOUS

## 2018-07-23 MED ORDER — MIDAZOLAM HCL 2 MG/2ML IJ SOLN
INTRAMUSCULAR | Status: AC
  Filled 2018-07-23: qty 2

## 2018-07-23 MED ORDER — BUPIVACAINE HCL (PF) 0.25 % IJ SOLN
INTRAMUSCULAR | Status: DC | PRN
  Administered 2018-07-23: 12 mL

## 2018-07-23 MED ORDER — CEPHALEXIN 500 MG PO CAPS: 500.0000 mg | ORAL_CAPSULE | Freq: Four times a day (QID) | ORAL | 0 refills | Status: AC

## 2018-07-23 MED ORDER — FENTANYL CITRATE (PF) 250 MCG/5ML IJ SOLN
INTRAMUSCULAR | Status: AC
  Filled 2018-07-23: qty 5

## 2018-07-23 MED ORDER — MUPIROCIN 2 % EX OINT
1.0000 "application " | TOPICAL_OINTMENT | Freq: Once | CUTANEOUS | Status: AC
  Administered 2018-07-23: 1 via TOPICAL

## 2018-07-23 MED ORDER — LACTATED RINGERS IV SOLN
INTRAVENOUS | Status: DC
  Administered 2018-07-23: 13:00:00 via INTRAVENOUS

## 2018-07-23 MED ORDER — LIDOCAINE 2% (20 MG/ML) 5 ML SYRINGE
INTRAMUSCULAR | Status: DC | PRN
  Administered 2018-07-23: 60 mg via INTRAVENOUS

## 2018-07-23 MED ORDER — PROPOFOL 10 MG/ML IV BOLUS
INTRAVENOUS | Status: DC | PRN
  Administered 2018-07-23: 180 mg via INTRAVENOUS

## 2018-07-23 MED ORDER — SODIUM CHLORIDE 0.9 % IV BOLUS
1000.0000 mL | Freq: Once | INTRAVENOUS | Status: AC
  Administered 2018-07-23: 1000 mL via INTRAVENOUS

## 2018-07-23 MED ORDER — MUPIROCIN 2 % EX OINT
TOPICAL_OINTMENT | CUTANEOUS | Status: AC
  Administered 2018-07-23: 1 via TOPICAL
  Filled 2018-07-23: qty 22

## 2018-07-23 MED ORDER — CEFAZOLIN SODIUM-DEXTROSE 2-4 GM/100ML-% IV SOLN
2.0000 g | INTRAVENOUS | Status: AC
  Administered 2018-07-23: 14:00:00 2 g via INTRAVENOUS
  Filled 2018-07-23: qty 100

## 2018-07-23 MED ORDER — TETANUS-DIPHTH-ACELL PERTUSSIS 5-2.5-18.5 LF-MCG/0.5 IM SUSP: 0.5000 mL | Freq: Once | INTRAMUSCULAR | Status: DC

## 2018-07-23 MED ORDER — MORPHINE SULFATE (PF) 4 MG/ML IV SOLN
4.0000 mg | Freq: Once | INTRAVENOUS | Status: AC
  Administered 2018-07-23: 11:00:00 4 mg via INTRAVENOUS
  Filled 2018-07-23: qty 1

## 2018-07-23 MED ORDER — FENTANYL CITRATE (PF) 250 MCG/5ML IJ SOLN
INTRAMUSCULAR | Status: DC | PRN
  Administered 2018-07-23: 150 ug via INTRAVENOUS
  Administered 2018-07-23 (×2): 50 ug via INTRAVENOUS

## 2018-07-23 MED ORDER — BUPIVACAINE HCL (PF) 0.25 % IJ SOLN
INTRAMUSCULAR | Status: AC
  Filled 2018-07-23: qty 30

## 2018-07-23 MED ORDER — MIDAZOLAM HCL 2 MG/2ML IJ SOLN
INTRAMUSCULAR | Status: DC | PRN
  Administered 2018-07-23: 2 mg via INTRAVENOUS

## 2018-07-23 MED ORDER — HYDROCODONE-ACETAMINOPHEN 5-325 MG PO TABS: 1.0000 | ORAL_TABLET | ORAL | 0 refills | Status: AC | PRN

## 2018-07-23 MED ORDER — FENTANYL CITRATE (PF) 100 MCG/2ML IJ SOLN: 25.0000 ug | INTRAMUSCULAR | Status: DC | PRN

## 2018-07-23 SURGICAL SUPPLY — 47 items
BAG DECANTER FOR FLEXI CONT (MISCELLANEOUS) ×1 IMPLANT
BANDAGE ACE 3X5.8 VEL STRL LF (GAUZE/BANDAGES/DRESSINGS) IMPLANT
BANDAGE ACE 4X5 VEL STRL LF (GAUZE/BANDAGES/DRESSINGS) IMPLANT
BNDG COHESIVE 2X5 TAN STRL LF (GAUZE/BANDAGES/DRESSINGS) ×3 IMPLANT
BNDG CONFORM 2 STRL LF (GAUZE/BANDAGES/DRESSINGS) ×3 IMPLANT
BNDG GAUZE ELAST 4 BULKY (GAUZE/BANDAGES/DRESSINGS) IMPLANT
CORDS BIPOLAR (ELECTRODE) IMPLANT
COVER SURGICAL LIGHT HANDLE (MISCELLANEOUS) ×3 IMPLANT
COVER WAND RF STERILE (DRAPES) ×1 IMPLANT
CUFF TOURNIQUET SINGLE 18IN (TOURNIQUET CUFF) IMPLANT
DECANTER SPIKE VIAL GLASS SM (MISCELLANEOUS) ×3 IMPLANT
DRAPE SURG 17X23 STRL (DRAPES) ×4 IMPLANT
ELECT CAUTERY BLADE 6.4 (BLADE) ×3 IMPLANT
ELECT REM PT RETURN 9FT ADLT (ELECTROSURGICAL)
ELECTRODE REM PT RTRN 9FT ADLT (ELECTROSURGICAL) IMPLANT
FLUID NSS /IRRIG 3000 ML XXX (IV SOLUTION) IMPLANT
GAUZE PACKING IODOFORM 1/4X5 (PACKING) IMPLANT
GAUZE SPONGE 4X4 12PLY STRL (GAUZE/BANDAGES/DRESSINGS) ×4 IMPLANT
GAUZE XEROFORM 1X8 LF (GAUZE/BANDAGES/DRESSINGS) ×4 IMPLANT
GLOVE BIOGEL M 8.0 STRL (GLOVE) ×4 IMPLANT
GOWN STRL REUS W/ TWL LRG LVL3 (GOWN DISPOSABLE) ×4 IMPLANT
GOWN STRL REUS W/TWL LRG LVL3 (GOWN DISPOSABLE) ×8
HANDPIECE INTERPULSE COAX TIP (DISPOSABLE)
KIT BASIN OR (CUSTOM PROCEDURE TRAY) ×4 IMPLANT
KIT TURNOVER KIT B (KITS) ×4 IMPLANT
MANIFOLD NEPTUNE II (INSTRUMENTS) ×1 IMPLANT
NDL HYPO 25GX1X1/2 BEV (NEEDLE) IMPLANT
NEEDLE HYPO 25GX1X1/2 BEV (NEEDLE) IMPLANT
NS IRRIG 1000ML POUR BTL (IV SOLUTION) ×4 IMPLANT
PACK ORTHO EXTREMITY (CUSTOM PROCEDURE TRAY) ×4 IMPLANT
PAD ARMBOARD 7.5X6 YLW CONV (MISCELLANEOUS) ×8 IMPLANT
PAD CAST 4YDX4 CTTN HI CHSV (CAST SUPPLIES) IMPLANT
PADDING CAST COTTON 4X4 STRL (CAST SUPPLIES)
SET CYSTO W/LG BORE CLAMP LF (SET/KITS/TRAYS/PACK) IMPLANT
SET HNDPC FAN SPRY TIP SCT (DISPOSABLE) IMPLANT
SOAP 2 % CHG 4 OZ (WOUND CARE) ×4 IMPLANT
SPONGE LAP 18X18 RF (DISPOSABLE) IMPLANT
SPONGE LAP 4X18 RFD (DISPOSABLE) ×1 IMPLANT
SUT ETHILON 5 0 PS 2 18 (SUTURE) ×3 IMPLANT
SWAB CULTURE ESWAB REG 1ML (MISCELLANEOUS) IMPLANT
SYR CONTROL 10ML LL (SYRINGE) ×3 IMPLANT
TOWEL OR 17X24 6PK STRL BLUE (TOWEL DISPOSABLE) ×4 IMPLANT
TOWEL OR 17X26 10 PK STRL BLUE (TOWEL DISPOSABLE) ×4 IMPLANT
TUBE CONNECTING 12'X1/4 (SUCTIONS) ×1
TUBE CONNECTING 12X1/4 (SUCTIONS) ×3 IMPLANT
WATER STERILE IRR 1000ML POUR (IV SOLUTION) ×1 IMPLANT
YANKAUER SUCT BULB TIP NO VENT (SUCTIONS) ×4 IMPLANT

## 2018-07-23 NOTE — Transfer of Care (Signed)
Immediate Anesthesia Transfer of Care Note  Patient: Calvin Bradley  Procedure(s) Performed: revision of left finger Amputation (Left Hand)  Patient Location: PACU  Anesthesia Type:General  Level of Consciousness: awake, alert  and patient cooperative  Airway & Oxygen Therapy: Patient Spontanous Breathing  Post-op Assessment: Report given to RN and Post -op Vital signs reviewed and stable  Post vital signs: Reviewed and stable  Last Vitals:  Vitals Value Taken Time  BP 159/95 07/23/2018  2:50 PM  Temp    Pulse 92 07/23/2018  2:50 PM  Resp 11 07/23/2018  2:50 PM  SpO2 99 % 07/23/2018  2:50 PM  Vitals shown include unvalidated device data.  Last Pain:  Vitals:   07/23/18 1305  TempSrc:   PainSc: 7       Patients Stated Pain Goal: 3 (07/23/18 1305)  Complications: No apparent anesthesia complications

## 2018-07-23 NOTE — ED Notes (Signed)
Amputated part placed in bag, bag placed on ice. Bag and ice at bedside in view of the patient.

## 2018-07-23 NOTE — Consult Note (Signed)
Reason for Consult:Left long finger amputation Referring Physician: E Abasi Gaida is an 38 y.o. male.  HPI: Omega was working with a Systems analyst and didn't realize his foot was still on the pedal. It activated and came down onto his left long finger and sheared it off. He came to the ED for evaluation and hand surgery was consulted. He is RHD.  Past Medical History:  Diagnosis Date  . Hypertension   . Prediabetes 08/2010  . Unspecified vitamin D deficiency 2012    No past surgical history on file.  Family History  Problem Relation Age of Onset  . Hypertension Mother   . Diabetes Mother   . Hypertension Father   . Cancer Father        Skin    Social History:  reports that he has never smoked. He does not have any smokeless tobacco history on file. He reports current alcohol use. He reports that he does not use drugs.  Allergies:  Allergies  Allergen Reactions  . Betadine [Povidone Iodine]     "burns skin"   . Lisinopril     Face and hand numbness     Medications: I have reviewed the patient's current medications.  No results found for this or any previous visit (from the past 48 hour(s)).  No results found.  Review of Systems  Constitutional: Negative for weight loss.  HENT: Negative for ear discharge, ear pain, hearing loss and tinnitus.   Eyes: Negative for blurred vision, double vision, photophobia and pain.  Respiratory: Negative for cough, sputum production and shortness of breath.   Cardiovascular: Negative for chest pain.  Gastrointestinal: Negative for abdominal pain, nausea and vomiting.  Genitourinary: Negative for dysuria, flank pain, frequency and urgency.  Musculoskeletal: Positive for joint pain (Left long finger). Negative for back pain, falls, myalgias and neck pain.  Neurological: Negative for dizziness, tingling, sensory change, focal weakness, loss of consciousness and headaches.  Endo/Heme/Allergies: Does not bruise/bleed easily.   Psychiatric/Behavioral: Negative for depression, memory loss and substance abuse. The patient is not nervous/anxious.    Blood pressure (!) 140/102, pulse 92, temperature 97.9 F (36.6 C), temperature source Oral, resp. rate 18, height 5\' 9"  (1.753 m), weight 76.2 kg, SpO2 99 %. Physical Exam  Constitutional: He appears well-developed and well-nourished. No distress.  HENT:  Head: Normocephalic and atraumatic.  Eyes: Conjunctivae are normal. Right eye exhibits no discharge. Left eye exhibits no discharge. No scleral icterus.  Neck: Normal range of motion.  Cardiovascular: Normal rate and regular rhythm.  Respiratory: Effort normal. No respiratory distress.  Musculoskeletal:     Comments: Left shoulder, elbow, wrist, digits- Amputation long finger just distal to DIP joint, mod TTP, no instability, no blocks to motion  Sens  Ax/R/M/U intact  Mot   Ax/ R/ PIN/ M/ AIN/ U intact  Rad 2+  Neurological: He is alert.  Skin: Skin is warm and dry. He is not diaphoretic.  Psychiatric: He has a normal mood and affect. His behavior is normal.    Assessment/Plan: Left long finger amputation -- For revision amputation by Dr. Izora Ribas later today. NPO until then. Anticipate discharge after surgery.    Freeman Caldron, PA-C Orthopedic Surgery [phone] 07/23/2018, 11:10 AM

## 2018-07-23 NOTE — ED Triage Notes (Signed)
Left middle finger amputee that occurred while he was using a medal shear at work , pressure applied with bandage and bleeding controlled at this time

## 2018-07-23 NOTE — Anesthesia Procedure Notes (Signed)
Procedure Name: Intubation Date/Time: 07/23/2018 2:12 PM Performed by: Elayne Snare, CRNA Pre-anesthesia Checklist: Patient identified, Emergency Drugs available, Suction available and Patient being monitored Patient Re-evaluated:Patient Re-evaluated prior to induction Oxygen Delivery Method: Circle System Utilized Preoxygenation: Pre-oxygenation with 100% oxygen Induction Type: IV induction and Rapid sequence Laryngoscope Size: Mac and 3 Grade View: Grade I Tube type: Oral Tube size: 7.0 mm Number of attempts: 1 Airway Equipment and Method: Stylet and Oral airway Placement Confirmation: ETT inserted through vocal cords under direct vision,  positive ETCO2 and breath sounds checked- equal and bilateral Secured at: 22 cm Tube secured with: Tape Dental Injury: Teeth and Oropharynx as per pre-operative assessment

## 2018-07-23 NOTE — ED Provider Notes (Signed)
[facility] EMERGENCY DEPARTMENT Provider Note   CSN: [id number] Arrival date & time: 07/23/18  1041  History   Chief Complaint Finger amputation  HPI Calvin Bradley is a 38 y.o. male with past medical history significant for hypertension who presents for evaluation of finger amputation.  Patient states he works at a [employer] and was using the metal shear at work when he did not realize that his foot was still on the pedal.  This caused the middle shear to come down on his left upper extremity. Patient with amputation at DIP at his third digit on his left upper extremity. Bleeding controlled with pressure.  Patient is right-hand dominant.  His last tetanus was in 2019.  He denies use of anticoagulation.  No prior abdominal surgeries. No recent UR symptoms. Pain rated a 10/10. No radiation. Pain constant. No alcohol use. Takes Viagra for ED, last taken 1 week ago.  Last PO 8 am- Sunny D drink- No food    HPI  Past Medical History:  Diagnosis Date  . Hypertension   . Prediabetes 08/2010  . Unspecified vitamin D deficiency 2012    Patient Active Problem List   Diagnosis Date Noted  . Vitamin D Deficiency   . Prediabetes 08/31/2010  . ALLERGIC RHINITIS 05/27/2008  . Hypertension 05/13/2008    History reviewed. No pertinent surgical history.      Home Medications    Prior to Admission medications   Medication Sig Start Date End Date Taking? Authorizing Provider  acetaminophen (TYLENOL) 325 MG tablet Take 650 mg by mouth every 6 (six) hours as needed (dental pain).   Yes [provider]  BYSTOLIC 5 MG tablet Take 5 mg by mouth daily. 05/29/18  Yes [provider]  chlorhexidine (PERIDEX) 0.12 % solution Use as directed 5 mLs in the mouth or throat 2 (two) times daily.  07/17/18  Yes [provider]  HYDROcodone-acetaminophen (NORCO/VICODIN) 5-325 MG tablet Take 1 tablet by mouth every 4 (four) hours as needed for pain. 07/17/18  Yes  [provider]  ibuprofen (ADVIL) 200 MG tablet Take 800 mg by mouth every 8 (eight) hours as needed (dental pain).   Yes [provider]  sildenafil (VIAGRA) 100 MG tablet Take 1/2 to 1 tablet daily if needed for XXXX Patient not taking: Reported on 07/23/2018 05/05/14   Lucky Cowboy, MD  sildenafil (VIAGRA) 100 MG tablet TAKE ONE-HALF TO ONE TABLET  ONCE DAILY AS NEEDED Patient not taking: Reported on 07/23/2018 05/05/14   Lucky Cowboy, MD    Family History Family History  Problem Relation Age of Onset  . Hypertension Mother   . Diabetes Mother   . Hypertension Father   . Cancer Father        Skin    Social History Social History   Tobacco Use  . Smoking status: Never Smoker  Substance Use Topics  . Alcohol use: Yes    Comment: social  . Drug use: No     Allergies   Betadine [povidone iodine] and Lisinopril   Review of Systems Review of Systems  Constitutional: Negative.   HENT: Negative.   Respiratory: Negative.   Cardiovascular: Negative.   Gastrointestinal: Negative.   Genitourinary: Negative.   Musculoskeletal:       Left 3rd digit amputation  Skin: Positive for wound.  Neurological: Negative.   All other systems reviewed and are negative.  Physical Exam Updated Vital Signs BP 136/88 (BP Location: Right Arm)  Pulse 91   Temp 98 F (36.7 C) (Oral)   Resp 18   Ht 5\' 9"  (1.753 m)   Wt 76.2 kg   SpO2 100%   BMI 24.81 kg/m   Physical Exam Vitals signs and nursing note reviewed.  Constitutional:      General: He is not in acute distress.    Appearance: He is well-developed. He is not ill-appearing, toxic-appearing or diaphoretic.     Comments: Patient in obvious pain holding left hand.  HENT:     Head: Normocephalic and atraumatic.     Nose: Nose normal.     Mouth/Throat:     Comments: Mucous membranes moist. Eyes:     Pupils: Pupils are equal, round, and reactive to light.  Neck:     Musculoskeletal: Normal range of  motion and neck supple.  Cardiovascular:     Rate and Rhythm: Normal rate and regular rhythm.     Pulses: Normal pulses.     Heart sounds: Normal heart sounds.     Comments: No murmer, rubs or gallops. 2+ radial pulses bilaterally. Pulmonary:     Effort: Pulmonary effort is normal. No respiratory distress.     Comments: Clear to auscultation by without wheeze, rhonchi or rales.  No accessory muscle usage.  Speaking full sentences of difficulty. Abdominal:     General: There is no distension.     Palpations: Abdomen is soft.     Comments: Soft, nontender without rebound or guarding.  Musculoskeletal: Normal range of motion.     Comments: Left third digit with amputation at DIP.  He has full range of motion to his digits and upper extremity.  Mild oozing at amputation site.  No active arterial bleed.  Skin:    General: Skin is warm and dry.     Findings: Signs of injury present.     Comments: No pallor. Digit amputation. Cap fill brisk on digits 1-2,4-5.  Neurological:     Mental Status: He is alert.     Sensory: Sensation is intact.     Motor: Motor function is intact.     Gait: Gait is intact.     Comments: Intact sensation to LUE.        ED Treatments / Results  Labs (all labs ordered are listed, but only abnormal results are displayed) Labs Reviewed  COMPREHENSIVE METABOLIC PANEL - Abnormal; Notable for the following components:      Result Value   Glucose, Bld 139 (*)    All other components within normal limits  SURGICAL PCR SCREEN  CBC WITH DIFFERENTIAL/PLATELET    EKG None  Radiology Dg Finger Middle Left  Result Date: 07/23/2018 CLINICAL DATA:  Amputation with shears EXAM: LEFT THIRD FINGER 2+V COMPARISON:  None. FINDINGS: Frontal, oblique, and lateral views obtained. There has been amputation at the level of the junction of the proximal and mid thirds of the third distal phalanx. No other fracture evident. No dislocation. Joint spaces appear normal. There is  an overlying bandage. No radiopaque foreign body beyond bandage evident. IMPRESSION: Amputation at the junction of the proximal and mid thirds of the third distal phalanx. No other evident fracture. No dislocation or arthropathy. Overlying bandage. No other radiopaque foreign body. Electronically Signed   By: Bretta Bang III M.D.   On: 07/23/2018 11:53   Procedures Procedures (including critical care time)  Medications Ordered in ED Medications  chlorhexidine (HIBICLENS) 4 % liquid 4 application (has no administration in time range)  ceFAZolin (  ANCEF) IVPB 2g/100 mL premix (has no administration in time range)  lactated ringers infusion ( Intravenous New Bag/Given 07/23/18 1306)  sodium chloride 0.9 % bolus 1,000 mL (0 mLs Intravenous Stopped 07/23/18 1244)  morphine 4 MG/ML injection 4 mg (4 mg Intravenous Given 07/23/18 1102)  mupirocin ointment (BACTROBAN) 2 % 1 application (1 application Topical Given 07/23/18 1306)   Initial Impression / Assessment and Plan / ED Course  I have reviewed the triage vital signs and the nursing notes.  Pertinent labs & imaging results that were available during my care of the patient were reviewed by me and considered in my medical decision making (see chart for details).  38 year old male appears otherwise well presents for evaluation of left digit amputation which occurred just PTA.  Tetanus up-to-date.  Last p.o. intake at 8 AM which was [place]- clear liquid.  No food intake.  No anticoagulation.  Past medical history significant for hypertension.  Patient full range of motion bilateral upper extremities without difficulty.  He does have amputation just distal to his DIP joint on his third digit.  Mild oozing of blood controlled with pressure.  No evidence of arterial bleed.  He is able to flex and extend without difficulty, however with has severe tenderness to palpation to 3rd digit.  Lungs clear.  Abdomen soft, heart clear.  Minute.  He does have allergy  to iodine.  1100: [facility]. Patient to the OR for I/D, repair. Will order 1 g ANCEF for open wound and IVF. Tetanus UTD. Labs- CBC and CMP without abnormality.  1200: Per nursing, patient to OR. ANCEF ordered by Margaretha GlassingJeffery PA-C. Unable to start Ancef prior to OR. Pharmacy to send to OR. Plain film with amputation distal to DIP on long finger. Care transferred to Hand service to OR with Dr. Izora Ribasoley. Anticipated dc home after surgery, per Hand service note.  Patient hemodynamically stable and pain managed at care transfer.     Final Clinical Impressions(s) / ED Diagnoses   Final diagnoses:  Amputation of digit of left hand, initial encounter    ED Discharge Orders    None       Ezell Poke A, PA-C 07/23/18 1359    Tilden Fossaees, Elizabeth, MD 07/24/18 1035

## 2018-07-23 NOTE — Discharge Instructions (Signed)
Discharge Instructions:  Keep your dressing clean, dry and in place until instructed to remove by Dr. Neriah Brott.  If the dressing becomes dirty or wet call the office for instructions during business hours. Elevate the extremity to help with swelling, this will also help with any discomfort. Take your medication as prescribed. No lifting with the injured  extremity. If you feel that the dressing is too tight, you may loosen it, but keep it on; finger tips should be pink; if there is a concern, call the office. [phone] Ice may be used if the injury is a fracture, do not apply ice directly to the skin. Please call the office on the next business day after discharge to arrange a follow up appointment.  Call [phone] between the hours of 9am - 5pm M-Th or 9am - 1pm on Fri. For most hand injuries and/or conditions, you may return to work using the uninjured hand (one handed duty) within 24-72 hours.  A detailed note will be provided to you at your follow up appointment or may contact the office prior to your follow up.    

## 2018-07-23 NOTE — ED Notes (Signed)
All patient belongings and valuables given to wife

## 2018-07-23 NOTE — ED Triage Notes (Signed)
TC to OR to request IV pre-op Ancef. Pharmacy will send to OR

## 2018-07-23 NOTE — Progress Notes (Signed)
I have seen and examined this patient.  I agree with the H&P by Charma Igo, PA.  Pt has a tip amputation LLF.  Discussed surgical options with patient.  Will proceed with revision amputation.  All questions answered.  Pt agrees with treatment plan.

## 2018-07-23 NOTE — Anesthesia Preprocedure Evaluation (Addendum)
Anesthesia Evaluation  Patient identified by MRN, date of birth, ID band Patient awake    Reviewed: Allergy & Precautions, NPO status , Patient's Chart, lab work & pertinent test results  Airway Mallampati: II  TM Distance: >3 FB     Dental   Pulmonary neg pulmonary ROS,    breath sounds clear to auscultation       Cardiovascular hypertension,  Rhythm:Regular Rate:Normal     Neuro/Psych    GI/Hepatic negative GI ROS, Neg liver ROS,   Endo/Other    Renal/GU      Musculoskeletal   Abdominal   Peds  Hematology   Anesthesia Other Findings   Reproductive/Obstetrics                            Anesthesia Physical Anesthesia Plan  ASA: I and emergent  Anesthesia Plan: General   Post-op Pain Management:    Induction: Intravenous  PONV Risk Score and Plan: Ondansetron, Dexamethasone and Midazolam  Airway Management Planned: Oral ETT  Additional Equipment:   Intra-op Plan:   Post-operative Plan:   Informed Consent: I have reviewed the patients History and Physical, chart, labs and discussed the procedure including the risks, benefits and alternatives for the proposed anesthesia with the patient or authorized representative who has indicated his/her understanding and acceptance.     Dental advisory given  Plan Discussed with: CRNA and Anesthesiologist  Anesthesia Plan Comments:         Anesthesia Quick Evaluation

## 2018-07-24 ENCOUNTER — Encounter (HOSPITAL_COMMUNITY): Payer: Self-pay | Admitting: General Surgery

## 2018-07-24 NOTE — Anesthesia Postprocedure Evaluation (Signed)
Anesthesia Post Note  Patient: Calvin Bradley  Procedure(s) Performed: revision of left finger Amputation (Left Hand)     Patient location during evaluation: PACU Anesthesia Type: General Level of consciousness: awake Pain management: pain level controlled Vital Signs Assessment: post-procedure vital signs reviewed and stable Respiratory status: spontaneous breathing Cardiovascular status: stable Postop Assessment: no apparent nausea or vomiting Anesthetic complications: no    Last Vitals:  Vitals:   07/23/18 1518 07/23/18 1532  BP: 123/76 (!) 146/99  Pulse: 89 88  Resp: 18 18  Temp:  (!) 36.2 C  SpO2: 99% 99%    Last Pain:  Vitals:   07/23/18 1518  TempSrc:   PainSc: 0-No pain                 Jerol Rufener

## 2018-08-05 NOTE — Op Note (Signed)
NAMEABDULHADI, HAMILL MEDICAL RECORD [id number] ACCOUNT [id number] DATE OF BIRTH:10/21/1980 FACILITY: [facility] LOCATION: [facility] PHYSICIAN:Aliviah Spain C. Paradise Vensel, MD  OPERATIVE REPORT  DATE OF PROCEDURE:  07/23/2018  PREOPERATIVE DIAGNOSIS:  Partial fingertip amputation of the left long finger.  POSTOPERATIVE DIAGNOSIS:  Partial fingertip amputation of the left long finger.  PROCEDURE:  Revision amputation of the left long finger with local advancement flap and neurectomies.  ANESTHESIA:  General.  COMPLICATIONS:  No acute complications.  ESTIMATED BLOOD LOSS:  Minimal.  INDICATIONS:  The patient is a 38 year old gentleman that sustained a tip amputation to his left long finger.  This afternoon, he presented to the emergency room.  Risks, benefits and alternatives of surgical replantation and a revision amputation were  discussed with the patient.  The patient was told that the amputated part would be assessed as well as the neurovascular structures to determine if it was suitable for replantation.  Otherwise, a revision amputation will be performed.  He agreed with  this course of action.  Consent was obtained.  DESCRIPTION OF PROCEDURE:  The patient was taken to the operating room and placed supine on the operating table.  Anesthesia was administered without difficulty.  Timeout was performed, identifying the correct procedure and correct finger.  The left  upper extremity was prepped and draped in normal sterile fashion.  Tourniquet was used on the upper arm.  The arm was exsanguinated and the tourniquet was inflated.  The left long finger as well as the amputated part were evaluated.  The amputation was a  transverse type right at the base of the nail.  It was a crushing type injury and therefore replantation was felt not indicated.  The remaining portion of the distal phalanx was rongeured back to the DIP joint.  The soft tissues around the wound were  sharply debrided full thickness skin,  subcutaneous tissue, nail bed and bone.  The skin was fashioned into a fish mouth type orientation for closure.  Each neurovascular bundle on the radial and ulnar side were grasped, retracted and then cauterized.   Finally, the skin was brought over the end of the bone nicely approximating without any undue tension.  The tourniquet was released.  A sterile dressing was applied.  The patient tolerated the procedure well and was taken to recovery room in stable condition.   AN/NUANCE  D:08/04/2018 T:08/04/2018 JOB:[id number]

## 2019-10-07 IMAGING — DX LEFT MIDDLE FINGER 2+V
3 series · 3 of 3 positions shown · non-contrast
Comparison: None.

CLINICAL DATA: Amputation with shears

EXAM:
LEFT THIRD FINGER 2+V

[finger ap]
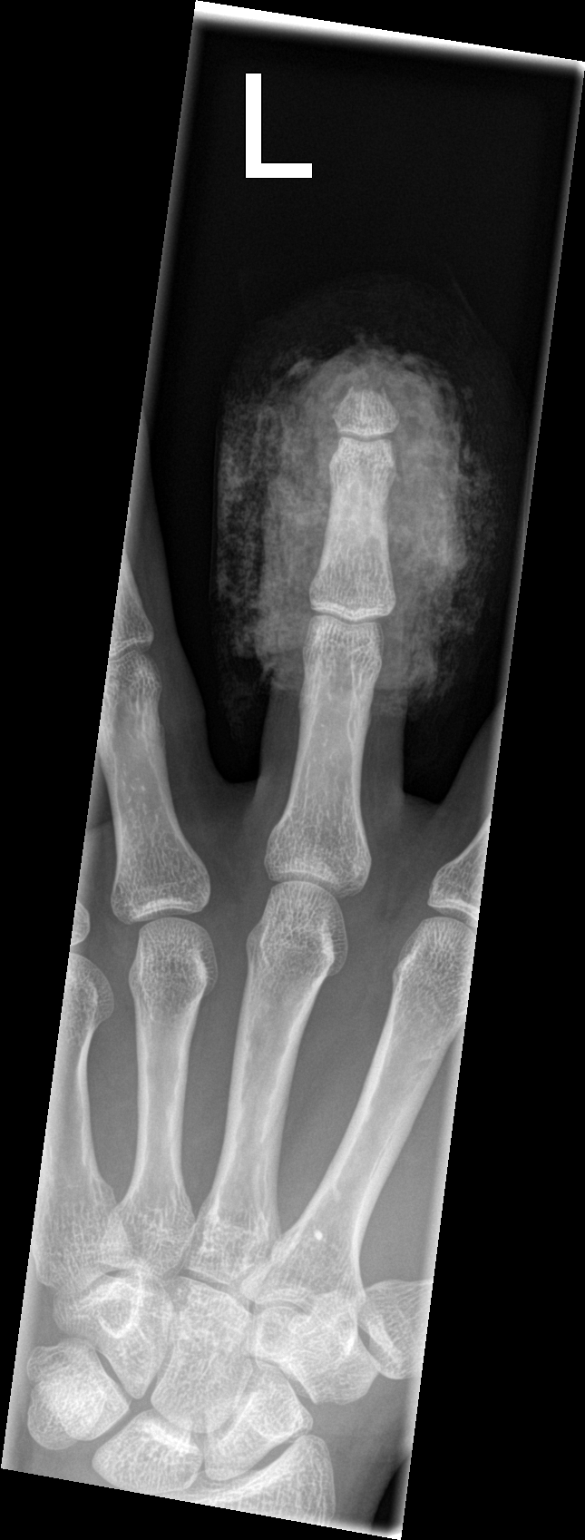

[finger obl]
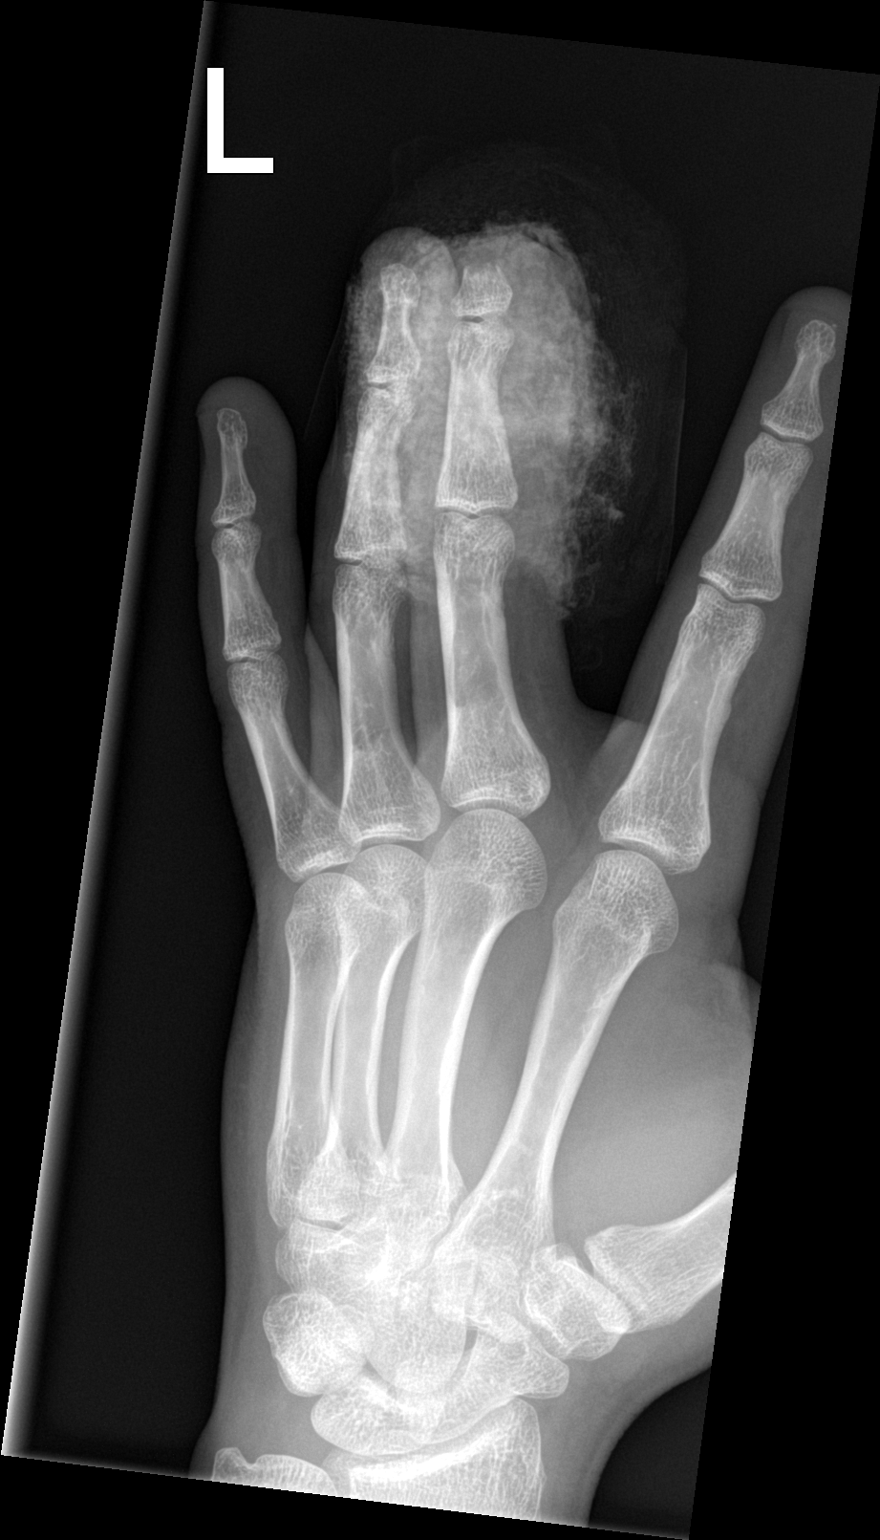

[finger lat]
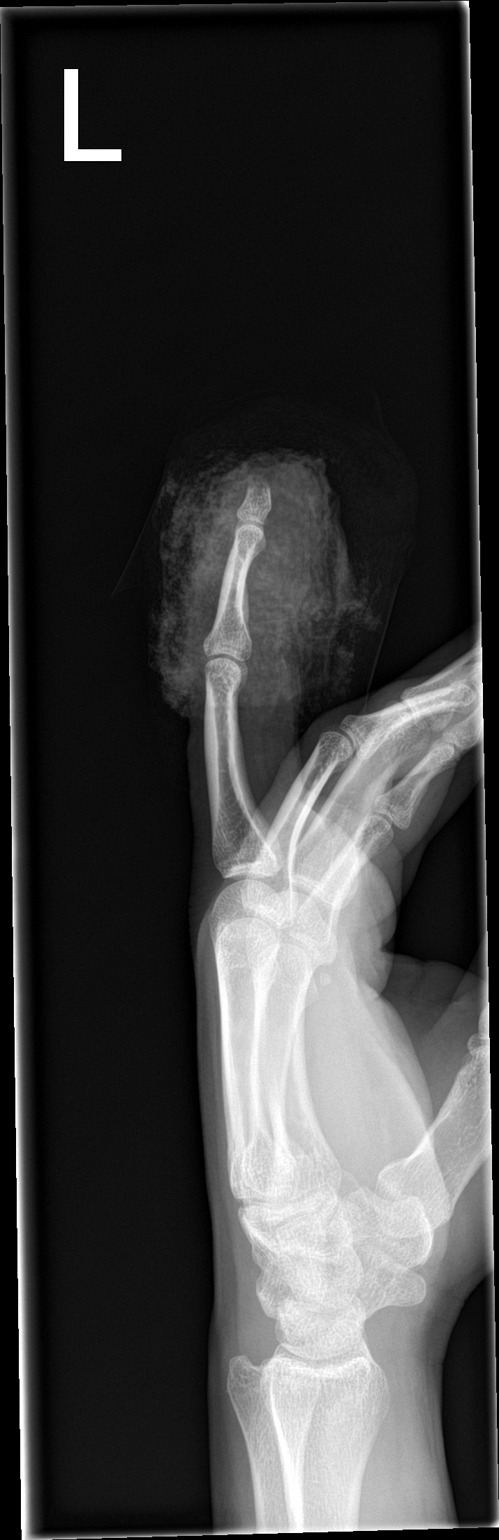

[3 of 3 positions shown; findings below may reference images not displayed]

FINDINGS: Frontal, oblique, and lateral views obtained. There has been
amputation at the level of the junction of the proximal and mid
thirds of the third distal phalanx. No other fracture evident. No
dislocation. Joint spaces appear normal. There is an overlying
bandage. No radiopaque foreign body beyond bandage evident.
IMPRESSION: Amputation at the junction of the proximal and mid thirds of the
third distal phalanx. No other evident fracture. No dislocation or
arthropathy. Overlying bandage. No other radiopaque foreign body.
